# Patient Record
Sex: Male | Born: 1995 | Race: White | Hispanic: No | Marital: Married | Smoking: Never smoker
Health system: Southern US, Community
[De-identification: ages and names within clinical notes are randomized; demographics above are authoritative.]

## PROBLEM LIST (undated history)

## (undated) ENCOUNTER — Emergency Department (HOSPITAL_COMMUNITY): Admission: EM | Payer: BLUE CROSS/BLUE SHIELD | Source: Home / Self Care

## (undated) DIAGNOSIS — R569 Unspecified convulsions: Secondary | ICD-10-CM

## (undated) DIAGNOSIS — J020 Streptococcal pharyngitis: Secondary | ICD-10-CM

---
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
## 2017-07-27 ENCOUNTER — Encounter (HOSPITAL_COMMUNITY): Payer: Self-pay | Admitting: Emergency Medicine

## 2017-07-27 ENCOUNTER — Emergency Department (HOSPITAL_COMMUNITY)
Admission: EM | Admit: 2017-07-27 | Discharge: 2017-07-28 | Disposition: A | Discharge status: Home / Self Care | Payer: BLUE CROSS/BLUE SHIELD | Attending: Emergency Medicine | Admitting: Emergency Medicine

## 2017-07-27 DIAGNOSIS — T675XXA Heat exhaustion, unspecified, initial encounter: Secondary | ICD-10-CM | POA: Diagnosis not present

## 2017-07-27 DIAGNOSIS — Z79899 Other long term (current) drug therapy: Secondary | ICD-10-CM | POA: Diagnosis not present

## 2017-07-27 DIAGNOSIS — Y9353 Activity, golf: Secondary | ICD-10-CM | POA: Insufficient documentation

## 2017-07-27 DIAGNOSIS — Y9239 Other specified sports and athletic area as the place of occurrence of the external cause: Secondary | ICD-10-CM | POA: Diagnosis not present

## 2017-07-27 DIAGNOSIS — R112 Nausea with vomiting, unspecified: Secondary | ICD-10-CM | POA: Diagnosis present

## 2017-07-27 DIAGNOSIS — Y999 Unspecified external cause status: Secondary | ICD-10-CM | POA: Insufficient documentation

## 2017-07-27 LAB — CBC WITH DIFFERENTIAL/PLATELET
Basophils Absolute: 0 10*3/uL (ref 0.0–0.1)
Basophils Relative: 0 %
Eosinophils Absolute: 0 10*3/uL (ref 0.0–0.7)
Eosinophils Relative: 0 %
HCT: 45.8 % (ref 39.0–52.0)
Hemoglobin: 16.3 g/dL (ref 13.0–17.0)
Lymphocytes Relative: 12 %
Lymphs Abs: 1.6 10*3/uL (ref 0.7–4.0)
MCH: 30 pg (ref 26.0–34.0)
MCHC: 35.6 g/dL (ref 30.0–36.0)
MCV: 84.2 fL (ref 78.0–100.0)
MONO ABS: 0.7 10*3/uL (ref 0.1–1.0)
Monocytes Relative: 6 %
Neutro Abs: 10.6 10*3/uL — ABNORMAL HIGH (ref 1.7–7.7)
Neutrophils Relative %: 82 %
Platelets: 214 10*3/uL (ref 150–400)
RBC: 5.44 MIL/uL (ref 4.22–5.81)
RDW: 12.4 % (ref 11.5–15.5)
WBC: 12.9 10*3/uL — AB (ref 4.0–10.5)

## 2017-07-27 LAB — COMPREHENSIVE METABOLIC PANEL
ALT: 15 U/L — AB (ref 17–63)
AST: 17 U/L (ref 15–41)
Albumin: 5.2 g/dL — ABNORMAL HIGH (ref 3.5–5.0)
Alkaline Phosphatase: 70 U/L (ref 38–126)
Anion gap: 10 (ref 5–15)
BUN: 20 mg/dL (ref 6–20)
CHLORIDE: 101 mmol/L (ref 101–111)
CO2: 26 mmol/L (ref 22–32)
CREATININE: 1.1 mg/dL (ref 0.61–1.24)
Calcium: 9.5 mg/dL (ref 8.9–10.3)
GFR calc Af Amer: >60 mL/min (ref >60)
GFR calc non Af Amer: >60 mL/min (ref >60)
GLUCOSE: 96 mg/dL (ref 65–99)
POTASSIUM: 3.5 mmol/L (ref 3.5–5.1)
Sodium: 137 mmol/L (ref 135–145)
Total Bilirubin: 1 mg/dL (ref 0.3–1.2)
Total Protein: 8 g/dL (ref 6.5–8.1)

## 2017-07-27 LAB — CK: Total CK: 153 U/L (ref 49–397)

## 2017-07-27 LAB — LIPASE, BLOOD: Lipase: 23 U/L (ref 11–51)

## 2017-07-27 MED ORDER — SODIUM CHLORIDE 0.9 % IV BOLUS
1000.0000 mL | Freq: Once | INTRAVENOUS | Status: AC
  Administered 2017-07-27: 1000 mL via INTRAVENOUS

## 2017-07-27 MED ORDER — ONDANSETRON HCL 4 MG/2ML IJ SOLN
4.0000 mg | Freq: Once | INTRAMUSCULAR | Status: AC
  Administered 2017-07-27: 4 mg via INTRAVENOUS
  Filled 2017-07-27: qty 2

## 2017-07-27 MED ORDER — SODIUM CHLORIDE 0.9 % IV BOLUS
1000.0000 mL | Freq: Once | INTRAVENOUS | Status: AC
  Administered 2017-07-28: 1000 mL via INTRAVENOUS

## 2017-07-27 NOTE — ED Triage Notes (Signed)
Pt states he was out in sun today for about 3-4hrs. States he was staying hydrated but now he is having tremors, nausea, and vomiting. States he is unable to keep anything down.

## 2017-07-28 LAB — URINALYSIS, ROUTINE W REFLEX MICROSCOPIC
BILIRUBIN URINE: NEGATIVE
Bacteria, UA: NONE SEEN
Glucose, UA: NEGATIVE mg/dL
HGB URINE DIPSTICK: NEGATIVE
Ketones, ur: NEGATIVE mg/dL
Nitrite: NEGATIVE
Protein, ur: NEGATIVE mg/dL
Specific Gravity, Urine: 1.032 — ABNORMAL HIGH (ref 1.005–1.030)
pH: 7 (ref 5.0–8.0)

## 2017-07-28 LAB — CK: CK TOTAL: 123 U/L (ref 49–397)

## 2017-07-28 NOTE — ED Provider Notes (Signed)
[facility] EMERGENCY DEPARTMENT Provider Note   CSN: [id number] Arrival date & time: 07/27/17  2118     History   Chief Complaint Chief Complaint  Patient presents with  . Emesis    HPI Phillip Ho is a 22 y.o. male.  Patient presents with headache, nausea vomiting and abdominal pain.  Symptoms started after he was playing golf outside in the sun today for about 4 hours.  States he was drinking a little bit while he was golfing.  When he got home he took a shower but then began to feel ill with gradual onset headache and nausea and vomiting.  Denies diarrhea denies fever denies abdominal pain.  He states he has had episodes in the past with sun exposure.  He denies any alcohol intake.  He denies any chest pain or shortness of breath.  He denies thunderclap onset of his headache.  No focal weakness, numbness or tingling.  Does have a history of seizures. Is feeling somewhat better at this time and has no abdominal pain.  The history is provided by the patient.  Emesis   Associated symptoms include headaches. Pertinent negatives include no abdominal pain, no arthralgias, no cough, no fever and no myalgias.    Past Medical History:  Diagnosis Date  . Seizures (HCC)    At age 32 or 21 and 48, negative EEG after 2nd episode  . Strep throat     Patient Active Problem List   Diagnosis Date Noted  . 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 Anxiety disorder 01/21/2014  . Depression 01/21/2014    History reviewed. No pertinent surgical history.      Home Medications    Prior to Admission medications   Medication Sig Start Date End Date Taking? Authorizing Provider  HYDROcodone-acetaminophen (NORCO/VICODIN) 5-325 MG per tablet Take 1 tablet by mouth every 6 (six) hours as needed for pain. Patient not taking: Reported on 06/02/2014 09/25/12   [name], PA-C  ibuprofen (ADVIL,MOTRIN) 600 MG tablet Take 1 tablet (600 mg total) by mouth 4 (four) times daily. 09/07/14   [name], PA-C  levETIRAcetam (KEPPRA) 250 MG tablet Take 1 tablet (250 mg total) by mouth 2 (two) times daily. Patient not taking: Reported on 09/07/2014 07/03/14   Margarita Grizzle, MD  levETIRAcetam (KEPPRA) 500 MG tablet Take 500 mg by mouth 2 (two) times daily. 08/18/14   [provider]    Family History Family History  Problem Relation Age of Onset  . Alcohol abuse Mother   . Depression Father   . Depression Paternal Grandmother   . Alcohol abuse Paternal Grandmother   . Cancer Paternal Grandfather   . Skin cancer Paternal Grandfather   . Heart disease Paternal Grandfather   . Diabetes Paternal Grandfather     Social History Social History   Tobacco Use  . Smoking status: Never Smoker  . Smokeless tobacco: Never Used  Substance Use Topics  . Alcohol use: No    Alcohol/week: 0.0 oz  . Drug use: Never     Allergies   Patient has no known allergies.   Review of Systems Review of Systems  Constitutional: Positive for fatigue. Negative for activity change, appetite change and fever.  HENT: Negative for congestion.   Eyes: Negative for visual disturbance.  Respiratory: Negative for cough, chest tightness and shortness of breath.   Cardiovascular: Negative for chest pain and leg swelling.  Gastrointestinal: Positive for nausea and vomiting. Negative for abdominal pain.  Genitourinary: Negative for dysuria,  hematuria and urgency.  Musculoskeletal: Negative for arthralgias and myalgias.  Skin: Negative for rash.  Neurological: Positive for headaches. Negative for dizziness and weakness.   all other systems are negative except as noted in the HPI and PMH.     Physical Exam Updated Vital Signs BP 117/70   Pulse 64   Temp 97.7 F (36.5 C) (Oral)   Resp (!) 30   Ht  (1.803 m)   Wt 77.1 kg (170 lb)   SpO2 100%   BMI 23.71 kg/m   Physical Exam  Constitutional: He is oriented to person, place, and time. He appears well-developed and well-nourished. No  distress.  HENT:  Head: Normocephalic and atraumatic.  Mouth/Throat: Oropharynx is clear and moist. No oropharyngeal exudate.  Mildly dry mucous membranes  Eyes: Pupils are equal, round, and reactive to light. Conjunctivae and EOM are normal.  Neck: Normal range of motion. Neck supple.  No meningismus.  Cardiovascular: Normal rate, regular rhythm, normal heart sounds and intact distal pulses.  No murmur heard. Pulmonary/Chest: Effort normal and breath sounds normal. No respiratory distress.  Abdominal: Soft. There is no tenderness. There is no rebound and no guarding.  Musculoskeletal: Normal range of motion. He exhibits no edema or tenderness.  Neurological: He is alert and oriented to person, place, and time. No cranial nerve deficit. He exhibits normal muscle tone. Coordination normal.  No ataxia on finger to nose bilaterally. No pronator drift. 5/5 strength throughout. CN 2-12 intact.Equal grip strength. Sensation intact.   Skin: Skin is warm. There is erythema.  Sunburn to arms and posterior neck  Psychiatric: He has a normal mood and affect. His behavior is normal.  Nursing note and vitals reviewed.    ED Treatments / Results  Labs (all labs ordered are listed, but only abnormal results are displayed) Labs Reviewed  CBC WITH DIFFERENTIAL/PLATELET - Abnormal; Notable for the following components:      Result Value   WBC 12.9 (*)    Neutro Abs 10.6 (*)    All other components within normal limits  COMPREHENSIVE METABOLIC PANEL - Abnormal; Notable for the following components:   Albumin 5.2 (*)    ALT 15 (*)    All other components within normal limits  URINALYSIS, ROUTINE W REFLEX MICROSCOPIC - Abnormal; Notable for the following components:   APPearance HAZY (*)    Specific Gravity, Urine 1.032 (*)    Leukocytes, UA SMALL (*)    All other components within normal limits  LIPASE, BLOOD  CK  CK    EKG None  Radiology No results found.  Procedures Procedures  (including critical care time)  Medications Ordered in ED Medications  sodium chloride 0.9 % bolus 1,000 mL (1,000 mLs Intravenous New Bag/Given 07/28/17 0000)  sodium chloride 0.9 % bolus 1,000 mL (1,000 mLs Intravenous New Bag/Given 07/27/17 2317)  ondansetron (ZOFRAN) injection 4 mg (4 mg Intravenous Given 07/27/17 2317)     Initial Impression / Assessment and Plan / ED Course  I have reviewed the triage vital signs and the nursing notes.  Pertinent labs & imaging results that were available during my care of the patient were reviewed by me and considered in my medical decision making (see chart for details).    Patient with likely heat exhaustion.  He has no abdominal pain.  His vitals are stable.  Patient will be hydrated and labs will be checked including CK.  Lab work is reassuring.  Creatinine is normal and CK is normal.  Patient given IV and p.o. fluids in the ED.  His abdominal exam is benign.  Patient feels improved after receiving 2 L of IV fluid and is tolerating p.o.  No vomiting.  CK is normal and check x2.  Heat exhaustion precautions given.  Discussed oral hydration at home and PCP follow-up.  Return precautions discussed.  Final Clinical Impressions(s) / ED Diagnoses   Final diagnoses:  Heat exhaustion, initial encounter    ED Discharge Orders    None       Linnie Delgrande, Jeannett Senior5/20/19 [phone]

## 2017-07-28 NOTE — ED Notes (Signed)
Pt tolerated fluid challenge of water well. No nausea or vomiting noted.

## 2017-07-28 NOTE — Discharge Instructions (Addendum)
Keep yourself hydrated.  Follow-up with your doctor.  Return to the ED if you develop new or worsening symptoms. °

## 2018-10-29 ENCOUNTER — Other Ambulatory Visit: Payer: Self-pay

## 2018-10-29 ENCOUNTER — Emergency Department
Admission: EM | Admit: 2018-10-29 | Discharge: 2018-10-29 | Disposition: A | Discharge status: Home / Self Care | Payer: BC Managed Care – PPO | Attending: Emergency Medicine | Admitting: Emergency Medicine

## 2018-10-29 DIAGNOSIS — R1030 Lower abdominal pain, unspecified: Secondary | ICD-10-CM | POA: Diagnosis present

## 2018-10-29 DIAGNOSIS — Y929 Unspecified place or not applicable: Secondary | ICD-10-CM | POA: Insufficient documentation

## 2018-10-29 DIAGNOSIS — Y999 Unspecified external cause status: Secondary | ICD-10-CM | POA: Insufficient documentation

## 2018-10-29 DIAGNOSIS — Y9367 Activity, basketball: Secondary | ICD-10-CM | POA: Diagnosis not present

## 2018-10-29 DIAGNOSIS — X509XXA Other and unspecified overexertion or strenuous movements or postures, initial encounter: Secondary | ICD-10-CM | POA: Diagnosis not present

## 2018-10-29 DIAGNOSIS — Z79899 Other long term (current) drug therapy: Secondary | ICD-10-CM | POA: Insufficient documentation

## 2018-10-29 LAB — CBC
HCT: 46.5 % (ref 39.0–52.0)
Hemoglobin: 16.1 g/dL (ref 13.0–17.0)
MCH: 29.3 pg (ref 26.0–34.0)
MCHC: 34.6 g/dL (ref 30.0–36.0)
MCV: 84.5 fL (ref 80.0–100.0)
Platelets: 211 10*3/uL (ref 150–400)
RBC: 5.5 MIL/uL (ref 4.22–5.81)
RDW: 12.2 % (ref 11.5–15.5)
WBC: 5.1 10*3/uL (ref 4.0–10.5)
nRBC: 0 % (ref 0.0–0.2)

## 2018-10-29 LAB — COMPREHENSIVE METABOLIC PANEL
ALT: 17 U/L (ref 0–44)
AST: 19 U/L (ref 15–41)
Albumin: 4.9 g/dL (ref 3.5–5.0)
Alkaline Phosphatase: 68 U/L (ref 38–126)
Anion gap: 8 (ref 5–15)
BUN: 19 mg/dL (ref 6–20)
CO2: 27 mmol/L (ref 22–32)
Calcium: 9.6 mg/dL (ref 8.9–10.3)
Chloride: 106 mmol/L (ref 98–111)
Creatinine, Ser: 0.95 mg/dL (ref 0.61–1.24)
GFR calc Af Amer: >60 mL/min (ref >60)
GFR calc non Af Amer: >60 mL/min (ref >60)
Glucose, Bld: 89 mg/dL (ref 70–99)
Potassium: 3.6 mmol/L (ref 3.5–5.1)
Sodium: 141 mmol/L (ref 135–145)
Total Bilirubin: 0.8 mg/dL (ref 0.3–1.2)
Total Protein: 7.3 g/dL (ref 6.5–8.1)

## 2018-10-29 LAB — URINALYSIS, COMPLETE (UACMP) WITH MICROSCOPIC
Bacteria, UA: NONE SEEN
Bilirubin Urine: NEGATIVE
Glucose, UA: NEGATIVE mg/dL
Hgb urine dipstick: NEGATIVE
Ketones, ur: NEGATIVE mg/dL
Leukocytes,Ua: NEGATIVE
Nitrite: NEGATIVE
Protein, ur: NEGATIVE mg/dL
Specific Gravity, Urine: 1.028 (ref 1.005–1.030)
Squamous Epithelial / LPF: NONE SEEN (ref 0–5)
pH: 6 (ref 5.0–8.0)

## 2018-10-29 LAB — TROPONIN I (HIGH SENSITIVITY): Troponin I (High Sensitivity): 7 ng/L (ref <18)

## 2018-10-29 LAB — LIPASE, BLOOD: Lipase: 24 U/L (ref 11–51)

## 2018-10-29 MED ORDER — NAPROXEN 500 MG PO TABS: 500.0000 mg | ORAL_TABLET | Freq: Two times a day (BID) | ORAL | 2 refills | Status: AC

## 2018-10-29 MED ORDER — NAPROXEN 500 MG PO TABS
500.0000 mg | ORAL_TABLET | Freq: Once | ORAL | Status: AC
  Administered 2018-10-29: 08:00:00 500 mg via ORAL
  Filled 2018-10-29: qty 1

## 2018-10-29 NOTE — ED Triage Notes (Signed)
See paper chart for downtime documentation 

## 2018-10-29 NOTE — ED Provider Notes (Signed)
[facility] Emergency Department Provider Note   ____________________________________________    I have reviewed the triage vital signs and the nursing notes.   HISTORY  Chief Complaint Abdominal Pain     HPI Phillip Ho is a 23 y.o. male who presents with complaints of lower abdominal discomfort.  Patient reports approximately 3 to 4 days of mild discomfort just below his umbilicus.  He reports he played basketball 2 days ago and is feeling sore from that his abdomen and legs and notes that the pain just beneath his umbilicus has worsened somewhat.  He reports it is worse when he is flexing his abdominal muscles.  Denies fevers or chills.  No nausea or vomiting.  No diarrhea.  No history of abdominal surgery.  Has not take anything for this.  Past Medical History:  Diagnosis Date  . Seizures (HCC)    At age 89 or 89 and 732014, negative EEG after 2nd episode  . Strep throat     Patient Active Problem List   Diagnosis Date Noted  . 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 Anxiety disorder 01/21/2014  . Depression 01/21/2014    No past surgical history on file.  Prior to Admission medications   Medication Sig Start Date End Date Taking? Authorizing Provider  HYDROcodone-acetaminophen (NORCO/VICODIN) 5-325 MG per tablet Take 1 tablet by mouth every 6 (six) hours as needed for pain. Patient not taking: Reported on 06/02/2014 09/25/12   Ivery QualeBryant, Hobson, PA-C  ibuprofen (ADVIL,MOTRIN) 600 MG tablet Take 1 tablet (600 mg total) by mouth 4 (four) times daily. 09/07/14   Ivery QualeBryant, Hobson, PA-C  levETIRAcetam (KEPPRA) 250 MG tablet Take 1 tablet (250 mg total) by mouth 2 (two) times daily. Patient not taking: Reported on 09/07/2014 07/03/14   Margarita Grizzleay, Danielle, MD  levETIRAcetam (KEPPRA) 500 MG tablet Take 500 mg by mouth 2 (two) times daily. 08/18/14   [provider]  naproxen (NAPROSYN) 500 MG tablet Take 1 tablet (500 mg total) by mouth 2 (two) times daily with  a meal. 10/29/18   Jene EveryKinner, Shaine Mount, MD     Allergies Patient has no known allergies.  Family History  Problem Relation Age of Onset  . Alcohol abuse Mother   . Depression Father   . Depression Paternal Grandmother   . Alcohol abuse Paternal Grandmother   . Cancer Paternal Grandfather   . Skin cancer Paternal Grandfather   . Heart disease Paternal Grandfather   . Diabetes Paternal Grandfather     Social History Social History   Tobacco Use  . Smoking status: Never Smoker  . Smokeless tobacco: Never Used  Substance Use Topics  . Alcohol use: No    Alcohol/week: 0.0 standard drinks  . Drug use: Never    Review of Systems  Constitutional: No fever/chills Eyes: No visual changes.  ENT: No sore throat. Cardiovascular: Denies chest pain. Respiratory: Denies shortness of breath. Gastrointestinal: As above Genitourinary: Negative for dysuria. Musculoskeletal: Sore from basketball Skin: Negative for rash. Neurological: Negative for headaches    ____________________________________________   PHYSICAL EXAM:  VITAL SIGNS: ED Triage Vitals [10/29/18 0725]  Enc Vitals Group     BP      Pulse      Resp      Temp      Temp src      SpO2      Weight      Height      Head Circumference      Peak Flow  Pain Score 0     Pain Loc      Pain Edu?      Excl. in [place]?     Constitutional: Alert and oriented.    Nose: No congestion/rhinnorhea. Mouth/Throat: Mucous membranes are moist.    Cardiovascular: Normal rate, regular rhythm. Grossly normal heart sounds.  Good peripheral circulation. Respiratory: Normal respiratory effort.  No retractions. Lungs CTAB. Gastrointestinal: No abdominal tenderness palpation, soft, no distention, no CVA tenderness, negative McBurney's point, no hernias Genitourinary: No hernia Musculoskeletal:  Warm and well perfused Neurologic:  Normal speech and language. No gross focal neurologic deficits are appreciated.  Skin:  Skin is warm,  dry and intact. No rash noted. Psychiatric: Mood and affect are normal. Speech and behavior are normal.  ____________________________________________   LABS (all labs ordered are listed, but only abnormal results are displayed)  Labs Reviewed  URINALYSIS, COMPLETE (UACMP) WITH MICROSCOPIC - Abnormal; Notable for the following components:      Result Value   Color, Urine YELLOW (*)    APPearance CLEAR (*)    All other components within normal limits  CBC  COMPREHENSIVE METABOLIC PANEL  LIPASE, BLOOD  TROPONIN I (HIGH SENSITIVITY)   ____________________________________________  EKG  None ____________________________________________  RADIOLOGY  None ____________________________________________   PROCEDURES  Procedure(s) performed: No  Procedures   Critical Care performed: No ____________________________________________   INITIAL IMPRESSION / ASSESSMENT AND PLAN / ED COURSE  Pertinent labs & imaging results that were available during my care of the patient were reviewed by me and considered in my medical decision making (see chart for details).  Patient well-appearing in no acute distress.  Reassuring exam, suspect muscle strain worsened by playing basketball.  Discussed with patient that we will try NSAIDs for 24 hours if worsening pain return to the emergency department for further evaluation/possible imaging.,  Patient agrees with this approach does not want imaging at this time.    ____________________________________________   FINAL CLINICAL IMPRESSION(S) / ED DIAGNOSES  Final diagnoses:  Lower abdominal pain        Note:  This document was prepared using Dragon voice recognition software and may include unintentional dictation errors.   Lavonia Drafts, MD 10/29/18 [phone]

## 2018-10-29 NOTE — ED Notes (Signed)
Patient denies any nausea/vomiting.  Reports occasional abdominal pain x 1 week worse yesterday particularly when lifting heavy objects.

## 2019-03-24 ENCOUNTER — Other Ambulatory Visit: Payer: Self-pay | Admitting: Acute Care

## 2019-03-24 ENCOUNTER — Other Ambulatory Visit (HOSPITAL_COMMUNITY): Payer: Self-pay | Admitting: Acute Care

## 2019-03-24 DIAGNOSIS — R569 Unspecified convulsions: Secondary | ICD-10-CM

## 2019-04-01 ENCOUNTER — Other Ambulatory Visit: Payer: Self-pay

## 2019-04-01 ENCOUNTER — Ambulatory Visit
Admission: RE | Admit: 2019-04-01 | Discharge: 2019-04-01 | Disposition: A | Discharge status: Home / Self Care | Payer: BC Managed Care – PPO | Source: Ambulatory Visit | Attending: Acute Care | Admitting: Acute Care

## 2019-04-01 DIAGNOSIS — R569 Unspecified convulsions: Secondary | ICD-10-CM | POA: Diagnosis present

## 2021-07-02 ENCOUNTER — Emergency Department: Payer: BC Managed Care – PPO

## 2021-07-02 ENCOUNTER — Encounter: Payer: Self-pay | Admitting: Emergency Medicine

## 2021-07-02 DIAGNOSIS — R1031 Right lower quadrant pain: Secondary | ICD-10-CM | POA: Diagnosis present

## 2021-07-02 LAB — URINALYSIS, ROUTINE W REFLEX MICROSCOPIC
Bilirubin Urine: NEGATIVE
Glucose, UA: 50 mg/dL — AB
Hgb urine dipstick: NEGATIVE
Ketones, ur: NEGATIVE mg/dL
Leukocytes,Ua: NEGATIVE
Nitrite: NEGATIVE
Protein, ur: NEGATIVE mg/dL
Specific Gravity, Urine: 1.03 (ref 1.005–1.030)
pH: 5 (ref 5.0–8.0)

## 2021-07-02 LAB — CBC
HCT: 44.5 % (ref 39.0–52.0)
Hemoglobin: 15.2 g/dL (ref 13.0–17.0)
MCH: 29 pg (ref 26.0–34.0)
MCHC: 34.2 g/dL (ref 30.0–36.0)
MCV: 84.8 fL (ref 80.0–100.0)
Platelets: 238 10*3/uL (ref 150–400)
RBC: 5.25 MIL/uL (ref 4.22–5.81)
RDW: 12.4 % (ref 11.5–15.5)
WBC: 6.9 10*3/uL (ref 4.0–10.5)
nRBC: 0 % (ref 0.0–0.2)

## 2021-07-02 LAB — COMPREHENSIVE METABOLIC PANEL
ALT: 13 U/L (ref 0–44)
AST: 16 U/L (ref 15–41)
Albumin: 5 g/dL (ref 3.5–5.0)
Alkaline Phosphatase: 73 U/L (ref 38–126)
Anion gap: 8 (ref 5–15)
BUN: 14 mg/dL (ref 6–20)
CO2: 28 mmol/L (ref 22–32)
Calcium: 9.1 mg/dL (ref 8.9–10.3)
Chloride: 104 mmol/L (ref 98–111)
Creatinine, Ser: 1.33 mg/dL — ABNORMAL HIGH (ref 0.61–1.24)
GFR, Estimated: >60 mL/min (ref >60)
Glucose, Bld: 85 mg/dL (ref 70–99)
Potassium: 3.6 mmol/L (ref 3.5–5.1)
Sodium: 140 mmol/L (ref 135–145)
Total Bilirubin: 0.6 mg/dL (ref 0.3–1.2)
Total Protein: 7.4 g/dL (ref 6.5–8.1)

## 2021-07-02 LAB — LIPASE, BLOOD: Lipase: 28 U/L (ref 11–51)

## 2021-07-02 MED ORDER — IOHEXOL 300 MG/ML  SOLN
100.0000 mL | Freq: Once | INTRAMUSCULAR | Status: AC | PRN
  Administered 2021-07-02: 100 mL via INTRAVENOUS

## 2021-07-02 NOTE — ED Triage Notes (Signed)
Pt presents via POV with complaints of RLQ for the last 3 days with associated nausea. No hx of abdominal sx or hernia. Denies vomiting.  ?

## 2021-07-03 ENCOUNTER — Emergency Department
Admission: EM | Admit: 2021-07-03 | Discharge: 2021-07-03 | Disposition: A | Discharge status: Home / Self Care | Payer: BC Managed Care – PPO | Attending: Emergency Medicine | Admitting: Emergency Medicine

## 2021-07-03 DIAGNOSIS — R1031 Right lower quadrant pain: Secondary | ICD-10-CM

## 2021-07-03 NOTE — ED Provider Notes (Signed)
? ?[facility] ?Provider Note ? ? ? Event Date/Time  ? First MD Initiated Contact with Patient 07/03/21 0034   ?  (approximate) ? ? ?History  ? ?Abdominal Pain ? ? ?HPI ? ?Phillip Ho is a 26 y.o. male with no chronic medical issues who presents for evaluation of about 3 days of persistent pain in his right lower quadrant.  No injury of which he is aware although he does lift and carry heavy things at work from time to time.  He has had no nausea nor vomiting.  His stools have been a little bit softer than usual but he is not having diarrhea.  He denies fever, sore throat, chest pain, shortness of breath, and cough.  He has had no pain when he urinates, no blood in his urine, and no increased urinary frequency.  The pain in his right lower quadrant does not radiate and it is sharp.  It does not involve or radiate to his testicles or penis. ?  ? ? ?Physical Exam  ? ?Triage Vital Signs: ?ED Triage Vitals  ?Enc Vitals Group  ?   BP 07/02/21 2131 140/86  ?   Pulse Rate 07/02/21 2131 80  ?   Resp 07/02/21 2131 18  ?   Temp 07/02/21 2131 98 ?F (36.7 ?C)  ?   Temp Source 07/02/21 2131 Oral  ?   SpO2 07/02/21 2131 99 %  ?   Weight 07/02/21 2130 77.1 kg (170 lb)  ?   Height 07/02/21 2130 1.803 m (5\' 11" )  ?   Head Circumference --   ?   Peak Flow --   ?   Pain Score 07/02/21 2130 5  ?   Pain Loc --   ?   Pain Edu? --   ?   Excl. in GC? --   ? ? ?Most recent vital signs: ?Vitals:  ? 07/03/21 0029 07/03/21 0032  ?BP: 100/87   ?Pulse: 76   ?Resp: 16   ?Temp:    ?SpO2: 100% 100%  ? ? ? ?General: Awake, no distress.  ?CV:  Good peripheral perfusion.  ?Resp:  Normal effort.  ?Abd:  No distention.  No tenderness to palpation of the epigastrium or right upper quadrant.  He has mild tenderness to palpation of the right lower quadrant with minimal voluntary guarding, no rebound tenderness, and negative Rovsing sign. ? ? ?ED Results / Procedures / Treatments  ? ?Labs ?(all labs ordered are listed, but only  abnormal results are displayed) ?Labs Reviewed  ?COMPREHENSIVE METABOLIC PANEL - Abnormal; Notable for the following components:  ?    Result Value  ? Creatinine, Ser 1.33 (*)   ? All other components within normal limits  ?URINALYSIS, ROUTINE W REFLEX MICROSCOPIC - Abnormal; Notable for the following components:  ? Color, Urine YELLOW (*)   ? APPearance HAZY (*)   ? Glucose, UA 50 (*)   ? All other components within normal limits  ?LIPASE, BLOOD  ?CBC  ? ? ? ?RADIOLOGY ?I personally reviewed the patient's CT scan of the abdomen/pelvis, and I see no evidence of acute abnormality.  Radiology report confirms no specific abnormalities nor explanation of symptoms. ? ? ? ?PROCEDURES: ? ?Critical Care performed: No ? ?Procedures ? ? ?MEDICATIONS ORDERED IN ED: ?Medications  ?iohexol (OMNIPAQUE) 300 MG/ML solution 100 mL (100 mLs Intravenous Contrast Given 07/02/21 2204)  ? ? ? ?IMPRESSION / MDM / ASSESSMENT AND PLAN / ED COURSE  ?I reviewed the triage  vital signs and the nursing notes. ?             ?               ? ?Differential diagnosis includes, but is not limited to, musculoskeletal strain, appendicitis, renal/ureteral colic, UTI/pyelonephritis, hernia. ? ?Patient is well-appearing and in no distress.  Stable vital signs that are within normal limits.  No urinary symptoms, no radiating pain or referred pain.  No nausea nor vomiting.  Some mild discomfort upon palpation but otherwise generally reassuring exam. ? ?Labs ordered include CMP, lipase, CBC, and urinalysis.  I reviewed all the results and the CMP is essentially normal other than a slight elevation of his creatinine but that could be based on muscle mass, particular given a normal BUN and no difficulties with oral intake.  Urinalysis is essentially normal other than slight glucosuria.  Lipase is within normal limits and CBC is within normal limits. ? ?Given persistent right lower quadrant pain for 3 days and otherwise healthy young male, I CT scan of the  abdomen and pelvis with IV contrast was ordered.  As documented above I reviewed the results, as did the radiologist, and there is no evidence of acute appendicitis or other acute abnormality. ? ?I provided reassurance and encouraged outpatient follow-up as needed.  I given usual customary return precautions and encouraged the use of over-the-counter acetaminophen and/or ibuprofen as needed for what seems likely to be musculoskeletal pain.  Patient understands and agrees with the plan. ? ? ? ? ?  ? ? ?FINAL CLINICAL IMPRESSION(S) / ED DIAGNOSES  ? ?Final diagnoses:  ?Abdominal pain, RLQ (right lower quadrant)  ? ? ? ?Rx / DC Orders  ? ?ED Discharge Orders   ? ? None  ? ?  ? ? ? ?Note:  This document was prepared using Dragon voice recognition software and may include unintentional dictation errors. ?  ?Loleta Rose, MD ?07/03/21 0117 ? ?

## 2021-07-03 NOTE — Discharge Instructions (Addendum)

## 2021-07-03 NOTE — ED Notes (Signed)
Pt states that he does not want any medication for his abdominal pain at this time. ?

## 2021-07-03 NOTE — ED Notes (Signed)
Pt denies nausea at this time. States that it's intermittent.  Denies vomiting, endorses stool has been softer than baseline, denies diarrhea. ?

## 2022-05-21 IMAGING — CT CT ABD-PELV W/ CM
2 of 4 series · 16 of 46 positions shown, 18 images · IV contrast (APPLIED)
Comparison: 02/13/2010

CLINICAL DATA: Right lower quadrant pain x3 days, nausea

EXAM:
CT ABDOMEN AND PELVIS WITH CONTRAST
TECHNIQUE: Multidetector CT imaging of the abdomen and pelvis was performed
using the standard protocol following bolus administration of
intravenous contrast.

[Series 2: axial st · axial · 0.73mm/px · z∈[-432,+8]mm · 13 of 98 slices shown, 15 images]
[im 5/98  soft-tissue]
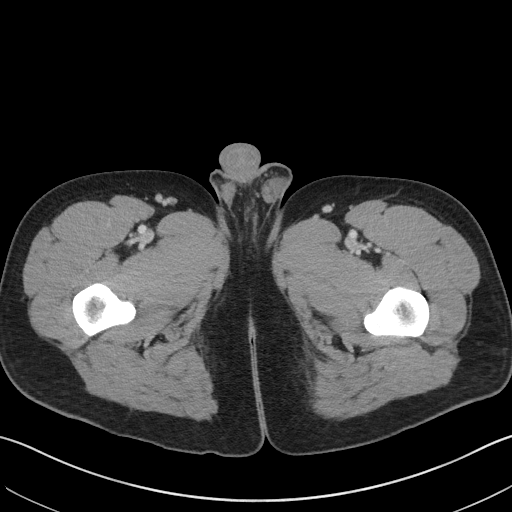
[im 5/98  bone]
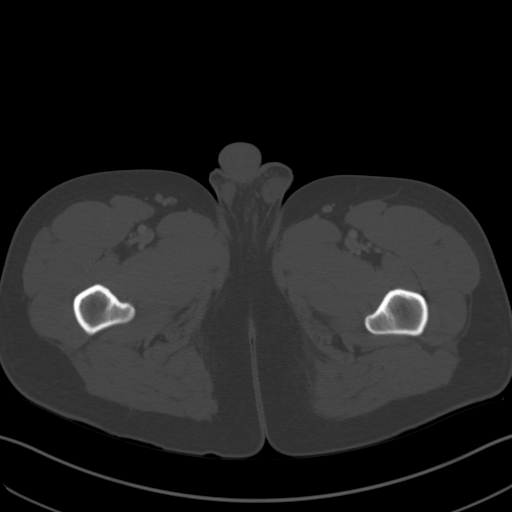
[im 13/98  soft-tissue]
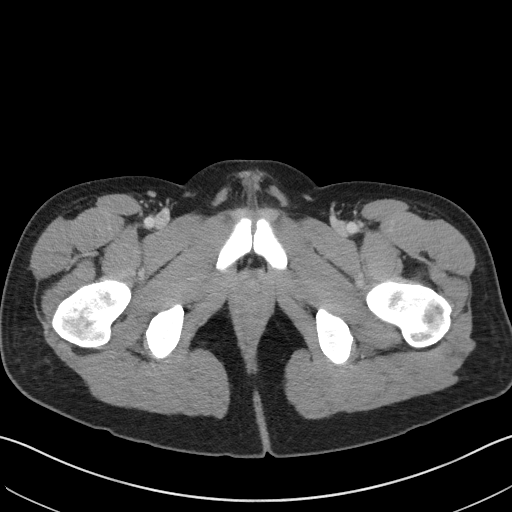
[im 22/98  soft-tissue]
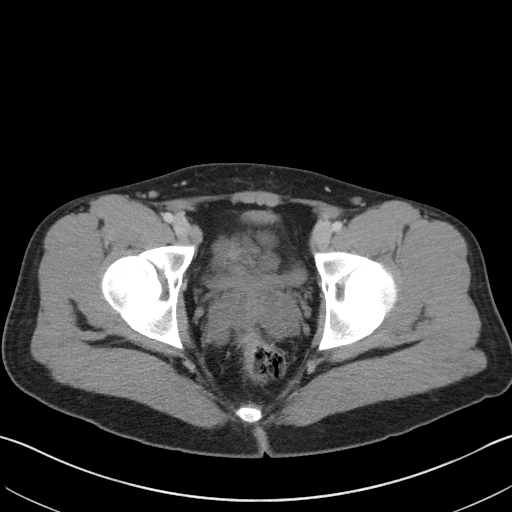
[im 26/98  soft-tissue]
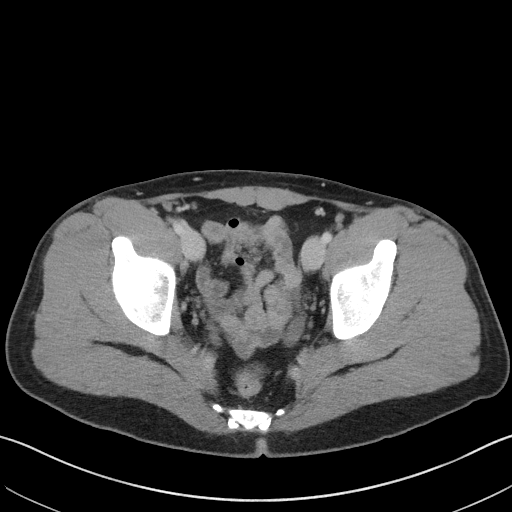
[im 34/98  soft-tissue]
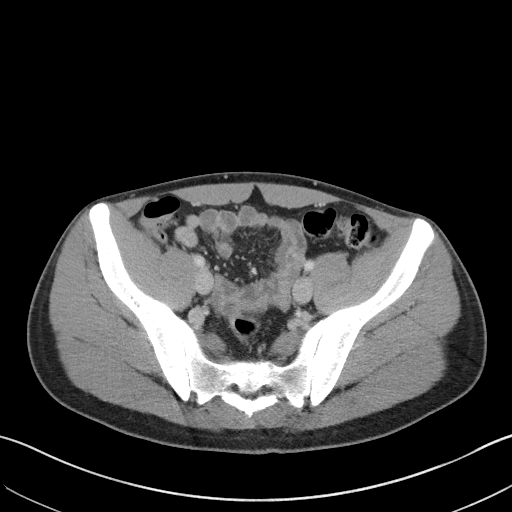
[im 43/98  soft-tissue]
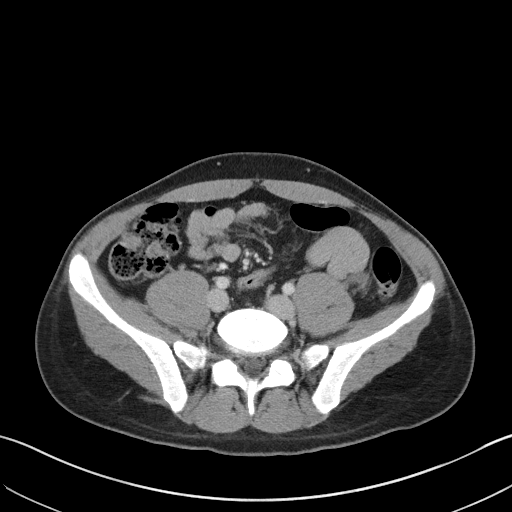
[im 51/98  soft-tissue]
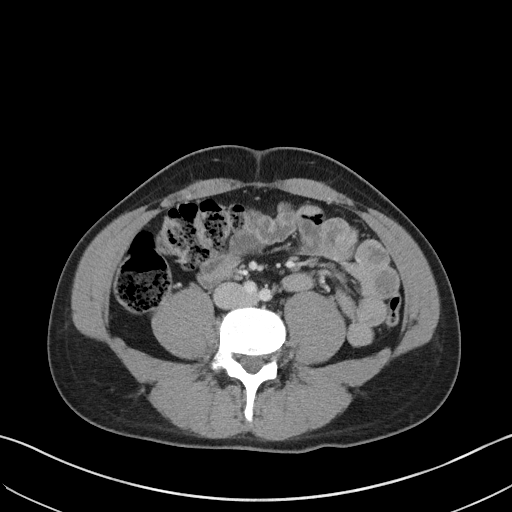
[im 55/98  soft-tissue]
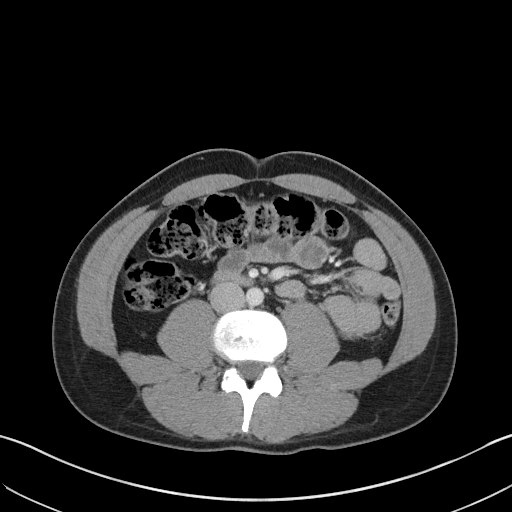
[im 64/98  soft-tissue]
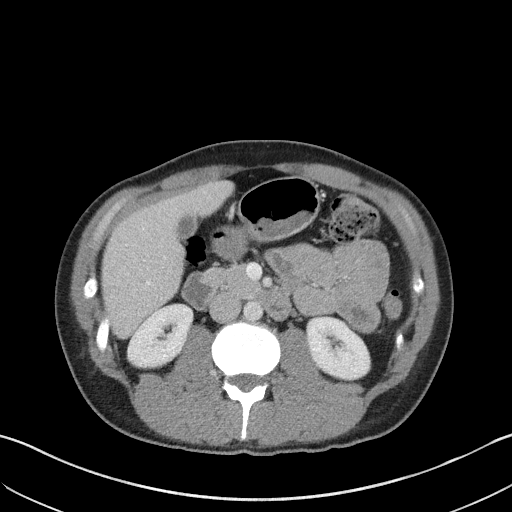
[im 64/98  bone]
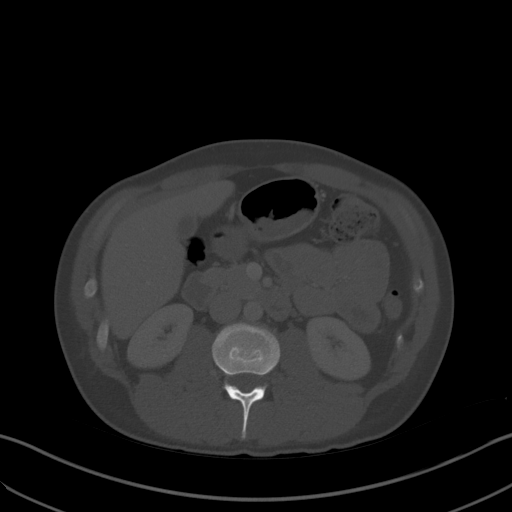
[im 72/98  soft-tissue]
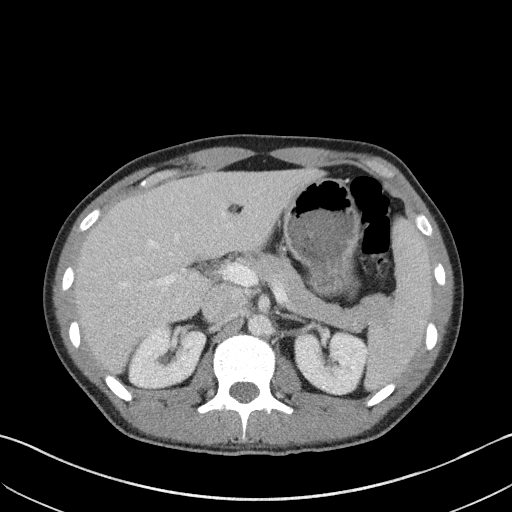
[im 76/98  soft-tissue]
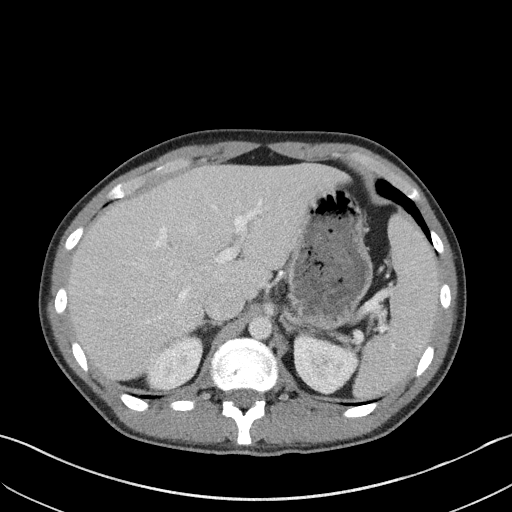
[im 85/98  soft-tissue]
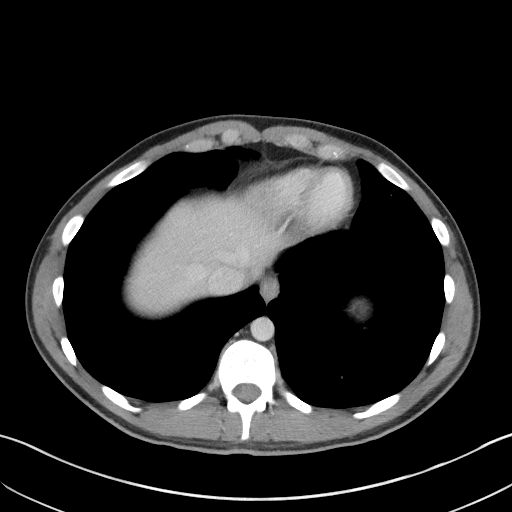
[im 93/98  soft-tissue]
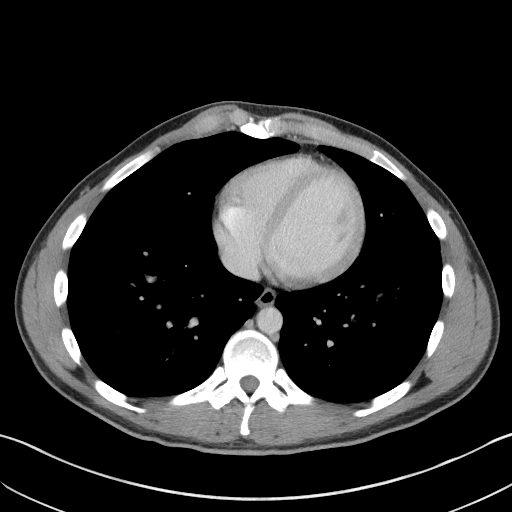

[Series 5: coronal st · coronal · 0.65mm/px · 3 of 88 slices shown]
[im 30/88  soft-tissue]
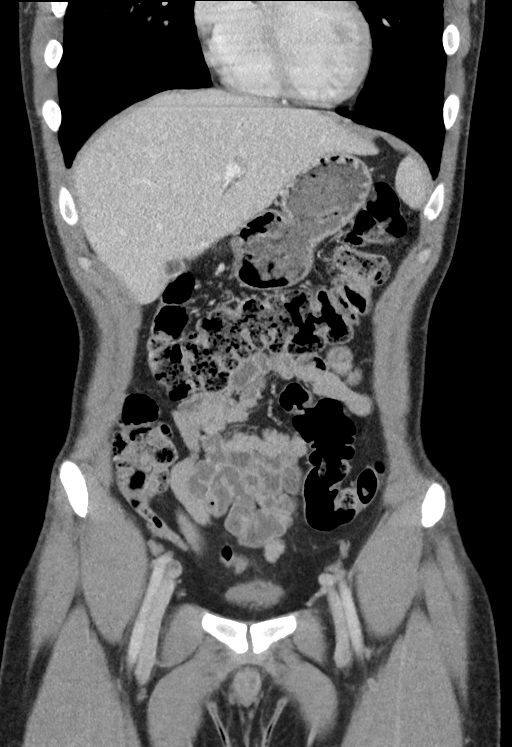
[im 39/88  soft-tissue]
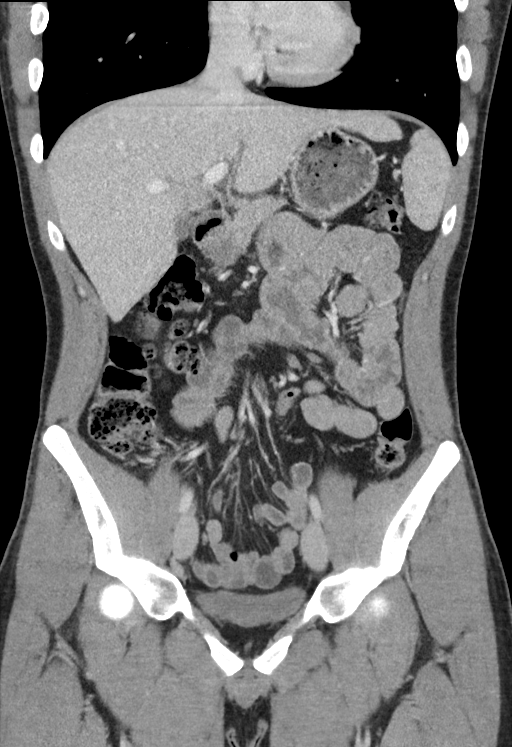
[im 49/88  soft-tissue]
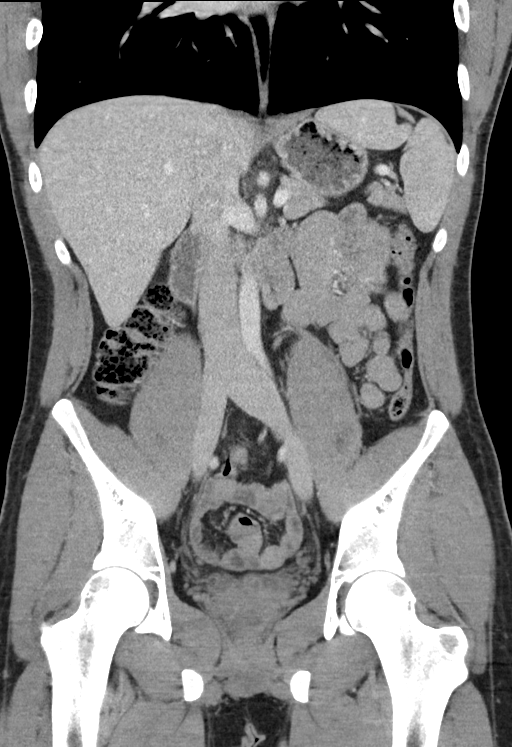

[16 of 46 positions shown; findings below may reference images not displayed]

RADIATION DOSE REDUCTION: This exam was performed according to the
departmental dose-optimization program which includes automated
exposure control, adjustment of the mA and/or kV according to
patient size and/or use of iterative reconstruction technique.

CONTRAST:  100mL OMNIPAQUE IOHEXOL 300 MG/ML  SOLN
FINDINGS: Lower chest: Lung bases are clear.

Hepatobiliary: Liver is within normal limits.

Gallbladder is unremarkable. No intrahepatic or extrahepatic duct
dilatation.

Pancreas: Within normal limits.

Spleen: Within normal limits.

Adrenals/Urinary Tract: Adrenal glands are within normal limits.

Kidneys are within normal limits.  No hydronephrosis.

Bladder is underdistended but unremarkable.

Stomach/Bowel: Stomach is within normal limits.

No evidence of bowel obstruction.

Normal appendix (series 2/image 69).

No colonic wall thickening or inflammatory changes.

Vascular/Lymphatic: No evidence of abdominal aortic aneurysm.

No suspicious abdominopelvic lymphadenopathy.

Reproductive: Prostate is unremarkable.

Other: No abdominopelvic ascites.

Musculoskeletal: Visualized osseous structures are within normal
limits.
IMPRESSION: Negative CT abdomen/pelvis.
